# Patient Record
Sex: Male | Born: 1995 | Race: White | Hispanic: No | Marital: Single | Smoking: Current every day smoker
Health system: Southern US, Community
[De-identification: ages and names within clinical notes are randomized; demographics above are authoritative.]

## PROBLEM LIST (undated history)

## (undated) HISTORY — PX: CARDIAC SURGERY: SHX584

---
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
## 2017-07-28 ENCOUNTER — Inpatient Hospital Stay
Admit: 2017-07-28 | Discharge: 2017-07-28 | Disposition: A | Discharge status: Home / Self Care | Attending: Emergency Medicine

## 2017-07-28 DIAGNOSIS — S0121XA Laceration without foreign body of nose, initial encounter: Secondary | ICD-10-CM

## 2017-07-28 MED ORDER — TETANUS-DIPHTH-ACELL PERTUSSIS 5-2.5-18.5 LF-MCG/0.5 IM SUSP
Freq: Once | INTRAMUSCULAR | Status: AC
Start: 2017-07-28 — End: 2017-07-28
  Administered 2017-07-28: 23:00:00 0.5 mL via INTRAMUSCULAR

## 2017-07-28 MED ORDER — AMOXICILLIN-POT CLAVULANATE 875-125 MG PO TABS
875-125 MG | ORAL_TABLET | Freq: Two times a day (BID) | ORAL | 0 refills | Status: AC
Start: 2017-07-28 — End: 2017-08-04

## 2017-07-28 MED ORDER — BACITRACIN ZINC 500 UNIT/GM EX OINT
500 UNIT/GM | Freq: Once | CUTANEOUS | Status: AC
Start: 2017-07-28 — End: 2017-07-28
  Administered 2017-07-28: 22:00:00 via TOPICAL

## 2017-07-28 MED ORDER — AMOXICILLIN-POT CLAVULANATE 875-125 MG PO TABS
875-125 MG | Freq: Once | ORAL | Status: AC
Start: 2017-07-28 — End: 2017-07-28
  Administered 2017-07-28: 22:00:00 1 via ORAL

## 2017-07-28 MED FILL — BOOSTRIX 5-2.5-18.5 LF-MCG/0.5 IM SUSP: 5-2.5-18.5 LF-MCG/0.5 | INTRAMUSCULAR | Qty: 0.5

## 2017-07-28 MED FILL — AMOXICILLIN-POT CLAVULANATE 875-125 MG PO TABS: 875-125 mg | ORAL | Qty: 1

## 2017-07-28 NOTE — ED Provider Notes (Signed)
Department of Emergency Medicine   ED  Provider Note  Admit Date/RoomTime: 07/28/2017  4:37 PM  ED Room: 02/02                HPI:  07/28/17, Time: 5:25 PM  .       Brett Miller is a 22 y.o. old male presenting to the emergency department for a laceration to the nose, caused by his dog, which occured 1 hour(s) prior to arrival. There is not a possibility of retained foreign body in the affected area.  The patients tetanus status is unknown.   Bleeding is  controlled. There is no pain at injury site. The injury was not work related.         Review of Systems:   Pertinent positives and negatives are stated within HPI, all other systems reviewed and are negative.        --------------------------------------------- PAST HISTORY ---------------------------------------------  Past Medical History:  has no past medical history on file.    Past Surgical History:  has no past surgical history on file.    Social History:  reports that he has never smoked. He has never used smokeless tobacco. He reports that he does not drink alcohol or use drugs.    Family History: family history is not on file.     The patient's home medications have been reviewed.    Allergies: Patient has no known allergies.    -------------------------------------------------- RESULTS -------------------------------------------------  All laboratory and radiology results have been personally reviewed by myself   LABS:  No results found for this visit on 07/28/17.    RADIOLOGY:  Interpreted by Radiologist.  No orders to display       ------------------------- NURSING NOTES AND VITALS REVIEWED ---------------------------   The nursing notes within the ED encounter and vital signs as below have been reviewed.   BP 122/62   Pulse 75   Temp 97.7 F (36.5 C)   Resp 15   Ht 5\' 8"  (1.727 m)   Wt 125 lb (56.7 kg)   SpO2 99%   BMI 19.01 kg/m   Oxygen Saturation Interpretation: Normal      ---------------------------------------------------PHYSICAL  EXAM--------------------------------------      Constitutional/General: Alert and oriented x3, well appearing, non toxic in NAD  Head: Normocephalic and atraumatic  Eyes: PERRL, EOMI  Mouth: Oropharynx clear, handling secretions, no trismus  Neck: Supple, full ROM,   Pulmonary: Lungs clear to auscultation bilaterally, no wheezes, rales, or rhonchi. Not in respiratory distress  Cardiovascular:  Regular rate and rhythm, no murmurs, gallops, or rubs. 2+ distal pulses  Abdomen: Soft, non tender, non distended,   Extremities: Moves all extremities x 4. Warm and well perfused  Skin: warm and dry without rash. There is a laceration of the nose, which measures 3 cm. It is a partial thickness laceration. There is no evidence of foreign body or gross contamination.   Neurologic: GCS 15,  Psych: Normal Affect      ------------------------------ ED COURSE/MEDICAL DECISION MAKING----------------------  Medications   amoxicillin-clavulanate (AUGMENTIN) 875-125 MG per tablet 1 tablet (1 tablet Oral Given 07/28/17 1725)             LACERATION REPAIR  PROCEDURE NOTE:  Unless otherwise indicated, this procedure was done or directly supervised by the ED attending.    Laceration #: 1.  Location: Nose  Length: 3 cm.  The wound area was cleansend with shur-clens  The wound area was anesthetized with not required.  WOUND COMPLEXITY:  Debridement: None.  Undermining: None.  Wound Margins Revised: None.  Flaps Aligned: no.  The wound was explored with the following results no foreign body or tendon injury seen.  The wound was closed with steri strips.  Dressing:  bacitracin and a sterile dressing was placed.    Total number steri-strips: 5      Medical Decision Making:    Patient presents to the ED for a dog bite to the face. The laceration was thoroughly irrigated and steri strips were applied to maintain alignment. I educated patient on reasons to return, particularly if the wound began to look infected. Ppx with augmentin given in ED  and script for rx as well.    Counseling:   The emergency provider has spoken with the patient and discussed today's results, in addition to providing specific details for the plan of care and counseling regarding the diagnosis and prognosis.  Questions are answered at this time and they are agreeable with the plan.      --------------------------------- IMPRESSION AND DISPOSITION ---------------------------------    IMPRESSION  1. Dog bite of nose, initial encounter    2. Laceration of nose, initial encounter        DISPOSITION  Disposition: Discharge to home  Patient condition is good         Elita Boone, DO  Resident  07/28/17 1728

## 2021-08-04 ENCOUNTER — Encounter: Payer: Self-pay | Admitting: Emergency Medicine

## 2021-08-04 ENCOUNTER — Emergency Department
Admission: EM | Admit: 2021-08-04 | Discharge: 2021-08-05 | Disposition: A | Discharge status: Home / Self Care | Payer: Self-pay | Attending: Emergency Medicine | Admitting: Emergency Medicine

## 2021-08-04 ENCOUNTER — Other Ambulatory Visit: Payer: Self-pay

## 2021-08-04 ENCOUNTER — Emergency Department: Payer: Self-pay

## 2021-08-04 DIAGNOSIS — F419 Anxiety disorder, unspecified: Secondary | ICD-10-CM | POA: Insufficient documentation

## 2021-08-04 DIAGNOSIS — R0789 Other chest pain: Secondary | ICD-10-CM

## 2021-08-04 LAB — URINALYSIS, COMPLETE (UACMP) WITH MICROSCOPIC
Bilirubin Urine: NEGATIVE
Glucose, UA: NEGATIVE mg/dL
Hgb urine dipstick: NEGATIVE
Ketones, ur: NEGATIVE mg/dL
Leukocytes,Ua: NEGATIVE
Nitrite: NEGATIVE
Protein, ur: NEGATIVE mg/dL
Specific Gravity, Urine: 1.024 (ref 1.005–1.030)
WBC, UA: NONE SEEN WBC/hpf (ref 0–5)
pH: 7 (ref 5.0–8.0)

## 2021-08-04 LAB — TROPONIN I (HIGH SENSITIVITY): Troponin I (High Sensitivity): 2 ng/L (ref <18)

## 2021-08-04 LAB — CBC
HCT: 45.5 % (ref 39.0–52.0)
Hemoglobin: 15.2 g/dL (ref 13.0–17.0)
MCH: 29.9 pg (ref 26.0–34.0)
MCHC: 33.4 g/dL (ref 30.0–36.0)
MCV: 89.4 fL (ref 80.0–100.0)
Platelets: 315 10*3/uL (ref 150–400)
RBC: 5.09 MIL/uL (ref 4.22–5.81)
RDW: 11.8 % (ref 11.5–15.5)
WBC: 6 10*3/uL (ref 4.0–10.5)
nRBC: 0 % (ref 0.0–0.2)

## 2021-08-04 LAB — COMPREHENSIVE METABOLIC PANEL
ALT: 17 U/L (ref 0–44)
AST: 19 U/L (ref 15–41)
Albumin: 4.6 g/dL (ref 3.5–5.0)
Alkaline Phosphatase: 63 U/L (ref 38–126)
Anion gap: 7 (ref 5–15)
BUN: 16 mg/dL (ref 6–20)
CO2: 28 mmol/L (ref 22–32)
Calcium: 9.2 mg/dL (ref 8.9–10.3)
Chloride: 104 mmol/L (ref 98–111)
Creatinine, Ser: 0.84 mg/dL (ref 0.61–1.24)
GFR, Estimated: >60 mL/min (ref >60)
Glucose, Bld: 125 mg/dL — ABNORMAL HIGH (ref 70–99)
Potassium: 3.9 mmol/L (ref 3.5–5.1)
Sodium: 139 mmol/L (ref 135–145)
Total Bilirubin: 0.5 mg/dL (ref 0.3–1.2)
Total Protein: 7.2 g/dL (ref 6.5–8.1)

## 2021-08-04 LAB — LIPASE, BLOOD: Lipase: 31 U/L (ref 11–51)

## 2021-08-04 MED ORDER — ALUMINUM-MAGNESIUM-SIMETHICONE 200-200-20 MG/5ML PO SUSP
30.0000 mL | Freq: Three times a day (TID) | ORAL | 0 refills | Status: DC
Start: 2021-08-04 — End: 2021-08-05

## 2021-08-04 MED ORDER — FAMOTIDINE 20 MG PO TABS: 20.0000 mg | ORAL_TABLET | Freq: Two times a day (BID) | ORAL | 0 refills | Status: DC

## 2021-08-04 MED ORDER — ALUM & MAG HYDROXIDE-SIMETH 200-200-20 MG/5ML PO SUSP
30.0000 mL | Freq: Once | ORAL | Status: AC
Start: 2021-08-04 — End: 2021-08-04
  Administered 2021-08-04: 30 mL via ORAL
  Filled 2021-08-04: qty 30

## 2021-08-04 MED ORDER — FAMOTIDINE 20 MG PO TABS
40.0000 mg | ORAL_TABLET | Freq: Once | ORAL | Status: AC
  Administered 2021-08-04: 40 mg via ORAL
  Filled 2021-08-04: qty 2

## 2021-08-04 MED ORDER — HYDROXYZINE HCL 25 MG PO TABS: 25.0000 mg | ORAL_TABLET | Freq: Three times a day (TID) | ORAL | 0 refills | Status: DC | PRN

## 2021-08-04 MED ORDER — ONDANSETRON HCL 4 MG/2ML IJ SOLN
4.0000 mg | Freq: Once | INTRAMUSCULAR | Status: AC
  Administered 2021-08-04: 4 mg via INTRAVENOUS
  Filled 2021-08-04: qty 2

## 2021-08-04 MED ORDER — HYDROXYZINE HCL 25 MG PO TABS
25.0000 mg | ORAL_TABLET | Freq: Once | ORAL | Status: AC
Start: 2021-08-04 — End: 2021-08-04
  Administered 2021-08-04: 25 mg via ORAL
  Filled 2021-08-04: qty 1

## 2021-08-04 NOTE — ED Triage Notes (Signed)
Pt endorses [place] use approx 8hrs PTA.   Pt give urine cup and encouraged to provide urine sample, states is unable to provide sample at this time. Pt given sample cup and directed to the lobby to await room.

## 2021-08-04 NOTE — ED Triage Notes (Signed)
Pt to ED via POV with c/o R sided chest pain and epigastric pain. Pt states happened approx 1 yr ago and was intermittent in nature. Pt states "I got myself in some situations and I think it's fucking me up". Pt states pain feel like "I'm gonna die". PT states pain is constant when it happens and pt's friend reports that he gets fidgety. Pt reports drinking alcohol and smoking weed and pain worsens. Pt unable to state when he began having episodes of chest pain again. Pt appears anxious in triage.

## 2021-08-04 NOTE — ED Notes (Signed)
Pt states he has been having chest pain that started around 6 months ago. Pt states that it comes and goes. Pt is not c/o chest pain at the moment.   Cardiac monitor reads NSR. Pt respirations are even and unlabored. Pt is talking in complete sentences. Pt does appear to be anxious. Pt NAD at this time.

## 2021-08-04 NOTE — ED Provider Notes (Signed)
[facility] Provider Note    Event Date/Time   First MD Initiated Contact with Patient 08/04/21 2122     (approximate)   History   Chest Pain   HPI  Cody Villa is a 26 y.o. male with a past history of ADHD who comes ED complaining of feeling anxious for the past few days, not improved with smoking marijuana today.  He reports that he gets some chest discomfort which radiates to his epigastrium.  He does go through periods of frequent heavy drinking, but does not think this is related.  He has been eating and drinking normally.  No vomiting or diarrhea, no black or bloody stool, no exertional symptoms.  No shortness of breath.     Physical Exam   Triage Vital Signs: ED Triage Vitals  Enc Vitals Group     BP 08/04/21 1918 124/89     Pulse Rate 08/04/21 1918 86     Resp 08/04/21 1918 20     Temp 08/04/21 1918 98.4 F (36.9 C)     Temp Source 08/04/21 1918 Oral     SpO2 08/04/21 1918 99 %     Weight 08/04/21 1919 125 lb (56.7 kg)     Height 08/04/21 1919 5\' 9"  (1.753 m)     Head Circumference --      Peak Flow --      Pain Score 08/04/21 1923 2     Pain Loc --      Pain Edu? --      Excl. in GC? --     Most recent vital signs: Vitals:   08/04/21 1918 08/04/21 2235  BP: 124/89 122/70  Pulse: 86 79  Resp: 20 16  Temp: 98.4 F (36.9 C)   SpO2: 99% 100%     General: Awake, no distress.  CV:  Good peripheral perfusion.  Regular rate and rhythm Resp:  Normal effort.  Clear to auscultation bilaterally Abd:  No distention.  Soft and nontender Other:  No lower extremity swelling or calf tenderness.  Moist mucosa.   ED Results / Procedures / Treatments   Labs (all labs ordered are listed, but only abnormal results are displayed) Labs Reviewed  COMPREHENSIVE METABOLIC PANEL - Abnormal; Notable for the following components:      Result Value   Glucose, Bld 125 (*)    All other components within normal limits  URINALYSIS, COMPLETE  (UACMP) WITH MICROSCOPIC - Abnormal; Notable for the following components:   Color, Urine YELLOW (*)    APPearance CLOUDY (*)    Bacteria, UA RARE (*)    All other components within normal limits  CBC  LIPASE, BLOOD  TROPONIN I (HIGH SENSITIVITY)     EKG  Interpreted by me Normal sinus rhythm rate of 90.  Normal axis intervals QRS ST segments and T waves.   RADIOLOGY Chest x-ray viewed and interpreted by me, appears unremarkable.  Radiology report reviewed    PROCEDURES:  Critical Care performed: No  Procedures   MEDICATIONS ORDERED IN ED: Medications  alum & mag hydroxide-simeth (MAALOX/MYLANTA) 200-200-20 MG/5ML suspension 30 mL (30 mLs Oral Given 08/04/21 2243)  famotidine (PEPCID) tablet 40 mg (40 mg Oral Given 08/04/21 2239)  ondansetron (ZOFRAN) injection 4 mg (4 mg Intravenous Given 08/04/21 2240)  hydrOXYzine (ATARAX) tablet 25 mg (25 mg Oral Given 08/04/21 2239)     IMPRESSION / MDM / ASSESSMENT AND PLAN / ED COURSE  I reviewed the triage vital signs and the nursing  notes.                              Differential diagnosis includes, but is not limited to, GERD, pancreatitis, marijuana side effect, dehydration    Patient presents with atypical, noncardiac chest pain and anxiousness.  Will give hydroxyzine as well as GI cocktail.  His vital signs, EKG, chest x-ray, labs are all reassuring, he is not requiring admission.     FINAL CLINICAL IMPRESSION(S) / ED DIAGNOSES   Final diagnoses:  Atypical chest pain     Rx / DC Orders   ED Discharge Orders          Ordered    hydrOXYzine (ATARAX) 25 MG tablet  3 times daily PRN        08/04/21 2326    aluminum-magnesium hydroxide-simethicone (MAALOX) 200-200-20 MG/5ML SUSP  3 times daily before meals & bedtime        08/04/21 2326    famotidine (PEPCID) 20 MG tablet  2 times daily        08/04/21 2326             Note:  This document was prepared using Dragon voice recognition software and may  include unintentional dictation errors.   Sharman Cheek, MD 08/04/21 [phone]

## 2021-08-05 MED ORDER — ALUMINUM-MAGNESIUM-SIMETHICONE 200-200-20 MG/5ML PO SUSP: 30.0000 mL | Freq: Three times a day (TID) | ORAL | 0 refills | Status: AC

## 2021-08-05 MED ORDER — FAMOTIDINE 20 MG PO TABS: 20.0000 mg | ORAL_TABLET | Freq: Two times a day (BID) | ORAL | 0 refills | Status: AC

## 2021-08-05 MED ORDER — HYDROXYZINE HCL 25 MG PO TABS: 25.0000 mg | ORAL_TABLET | Freq: Three times a day (TID) | ORAL | 0 refills | Status: AC | PRN

## 2021-08-05 NOTE — ED Notes (Signed)
Pt verbalized understanding of discharge instructions, follow-up care instructions, and prescriptions. E-signature pad is not working so Audiological scientist not available. Pt advised if symptoms worsen to return to ED.

## 2022-06-04 ENCOUNTER — Encounter: Payer: Self-pay | Admitting: Emergency Medicine

## 2022-06-04 ENCOUNTER — Other Ambulatory Visit: Payer: Self-pay

## 2022-06-04 ENCOUNTER — Emergency Department: Payer: Self-pay

## 2022-06-04 DIAGNOSIS — R42 Dizziness and giddiness: Secondary | ICD-10-CM | POA: Insufficient documentation

## 2022-06-04 DIAGNOSIS — Z1152 Encounter for screening for COVID-19: Secondary | ICD-10-CM | POA: Insufficient documentation

## 2022-06-04 DIAGNOSIS — Z5321 Procedure and treatment not carried out due to patient leaving prior to being seen by health care provider: Secondary | ICD-10-CM | POA: Insufficient documentation

## 2022-06-04 DIAGNOSIS — R079 Chest pain, unspecified: Secondary | ICD-10-CM | POA: Insufficient documentation

## 2022-06-04 DIAGNOSIS — R0602 Shortness of breath: Secondary | ICD-10-CM | POA: Insufficient documentation

## 2022-06-04 DIAGNOSIS — R531 Weakness: Secondary | ICD-10-CM | POA: Insufficient documentation

## 2022-06-04 LAB — CBC
HCT: 46 % (ref 39.0–52.0)
Hemoglobin: 15.7 g/dL (ref 13.0–17.0)
MCH: 30.3 pg (ref 26.0–34.0)
MCHC: 34.1 g/dL (ref 30.0–36.0)
MCV: 88.6 fL (ref 80.0–100.0)
Platelets: 284 10*3/uL (ref 150–400)
RBC: 5.19 MIL/uL (ref 4.22–5.81)
RDW: 11.7 % (ref 11.5–15.5)
WBC: 6.8 10*3/uL (ref 4.0–10.5)
nRBC: 0 % (ref 0.0–0.2)

## 2022-06-04 LAB — BASIC METABOLIC PANEL
Anion gap: 6 (ref 5–15)
BUN: 18 mg/dL (ref 6–20)
CO2: 27 mmol/L (ref 22–32)
Calcium: 9.6 mg/dL (ref 8.9–10.3)
Chloride: 108 mmol/L (ref 98–111)
Creatinine, Ser: 0.77 mg/dL (ref 0.61–1.24)
GFR, Estimated: >60 mL/min (ref >60)
Glucose, Bld: 104 mg/dL — ABNORMAL HIGH (ref 70–99)
Potassium: 3.8 mmol/L (ref 3.5–5.1)
Sodium: 141 mmol/L (ref 135–145)

## 2022-06-04 LAB — RESP PANEL BY RT-PCR (RSV, FLU A&B, COVID)  RVPGX2
Influenza A by PCR: NEGATIVE
Influenza B by PCR: NEGATIVE
Resp Syncytial Virus by PCR: NEGATIVE
SARS Coronavirus 2 by RT PCR: NEGATIVE

## 2022-06-04 LAB — TROPONIN I (HIGH SENSITIVITY): Troponin I (High Sensitivity): 3 ng/L (ref <18)

## 2022-06-04 NOTE — ED Provider Triage Note (Signed)
Emergency Medicine Provider Triage Evaluation Note  Ziv Welchel, a 26 y.o. male  was evaluated in triage.  Pt complains of chest pain, SOB, nausea, bodyaches. He notes symptoms with onset 1 week prior.   Review of Systems  Positive: Cough, SOB Negative: FCS  Physical Exam  Ht 5\' 8"  (1.727 m)   Wt 59 kg   BMI 19.77 kg/m  Gen:   Awake, no distress  NAD Resp:  Normal effort CTA MSK:   Moves extremities without difficulty  Other:    Medical Decision Making  Medically screening exam initiated at 10:55 PM.  Appropriate orders placed.  Idrees Quam was informed that the remainder of the evaluation will be completed by another provider, this initial triage assessment does not replace that evaluation, and the importance of remaining in the ED until their evaluation is complete.  Patient to the ED for evaluation of bodyaches, cough, and SOB.    Gordy Levan, PA-C 06/04/22 2257

## 2022-06-04 NOTE — ED Triage Notes (Signed)
Patient ambulatory to triage with steady gait, without difficulty or distress noted; pt reports upper CP accomp by [facility] and dizziness x wk

## 2022-06-05 ENCOUNTER — Emergency Department
Admission: EM | Admit: 2022-06-05 | Discharge: 2022-06-05 | Discharge status: Against Medical Advice | Payer: Self-pay | Attending: Student in an Organized Health Care Education/Training Program | Admitting: Student in an Organized Health Care Education/Training Program
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
## 2022-10-22 ENCOUNTER — Emergency Department: Payer: Self-pay

## 2022-10-22 ENCOUNTER — Other Ambulatory Visit: Payer: Self-pay

## 2022-10-22 ENCOUNTER — Emergency Department
Admission: EM | Admit: 2022-10-22 | Discharge: 2022-10-22 | Disposition: A | Discharge status: Home / Self Care | Payer: Self-pay | Attending: Emergency Medicine | Admitting: Emergency Medicine

## 2022-10-22 DIAGNOSIS — S4992XA Unspecified injury of left shoulder and upper arm, initial encounter: Secondary | ICD-10-CM | POA: Diagnosis present

## 2022-10-22 DIAGNOSIS — S42022A Displaced fracture of shaft of left clavicle, initial encounter for closed fracture: Secondary | ICD-10-CM | POA: Diagnosis not present

## 2022-10-22 DIAGNOSIS — Y92410 Unspecified street and highway as the place of occurrence of the external cause: Secondary | ICD-10-CM | POA: Diagnosis not present
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
## 2022-10-22 NOTE — Discharge Instructions (Signed)
Keep your arm in the sling for your clavicle fracture.  Please follow-up with orthopedics.  You may take Tylenol/ibuprofen per package instructions to help with your symptoms.  Please return for any new, worsening, or change in symptoms or other concerns.  It was a pleasure caring for you today.
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
## 2022-12-19 IMAGING — CR DG CHEST 2V
1 series · 2 of 2 positions shown · non-contrast
Comparison: None.

CLINICAL DATA: Right-sided chest pain.  Epigastric pain.

EXAM:
CHEST - 2 VIEW

[Series 1: dg chest 2 view · 0.14mm/px · 2 of 2 slices shown]
[im 1/2]
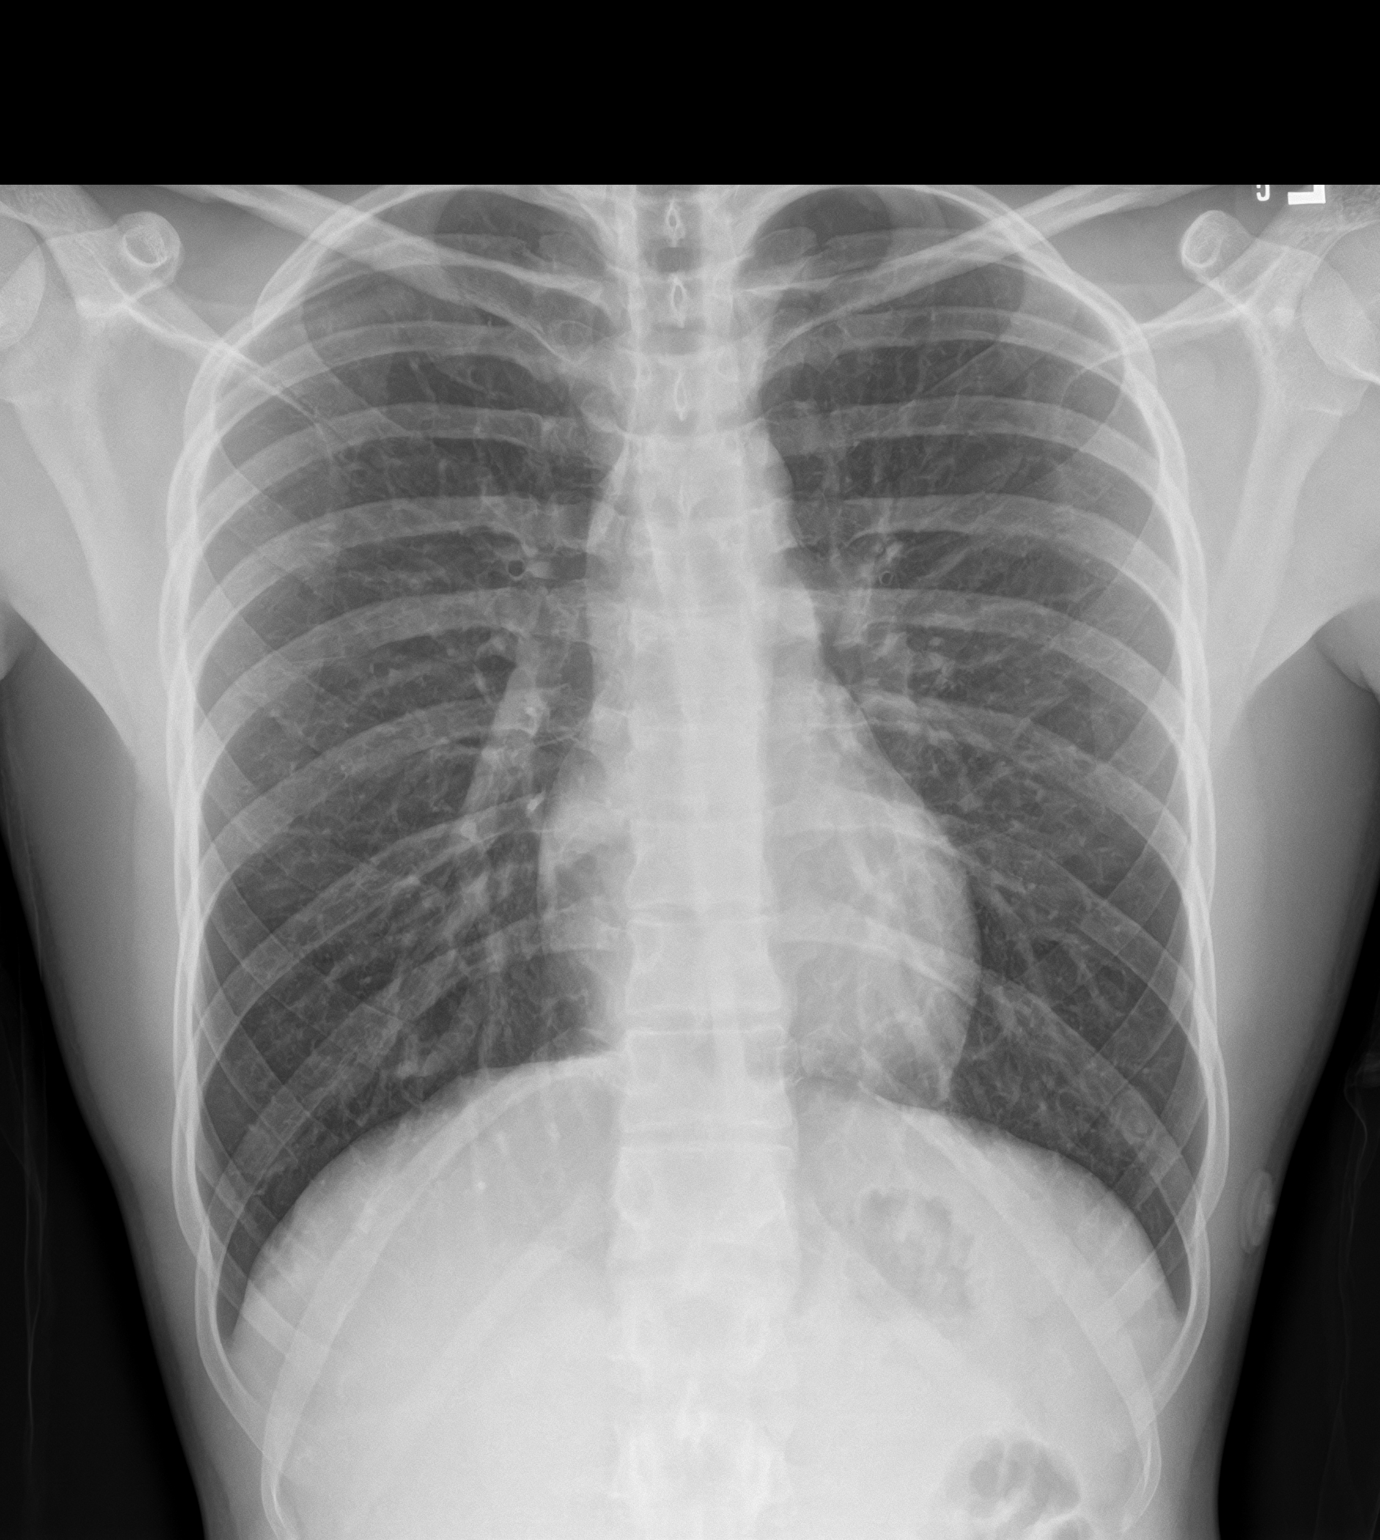
[im 2/2]
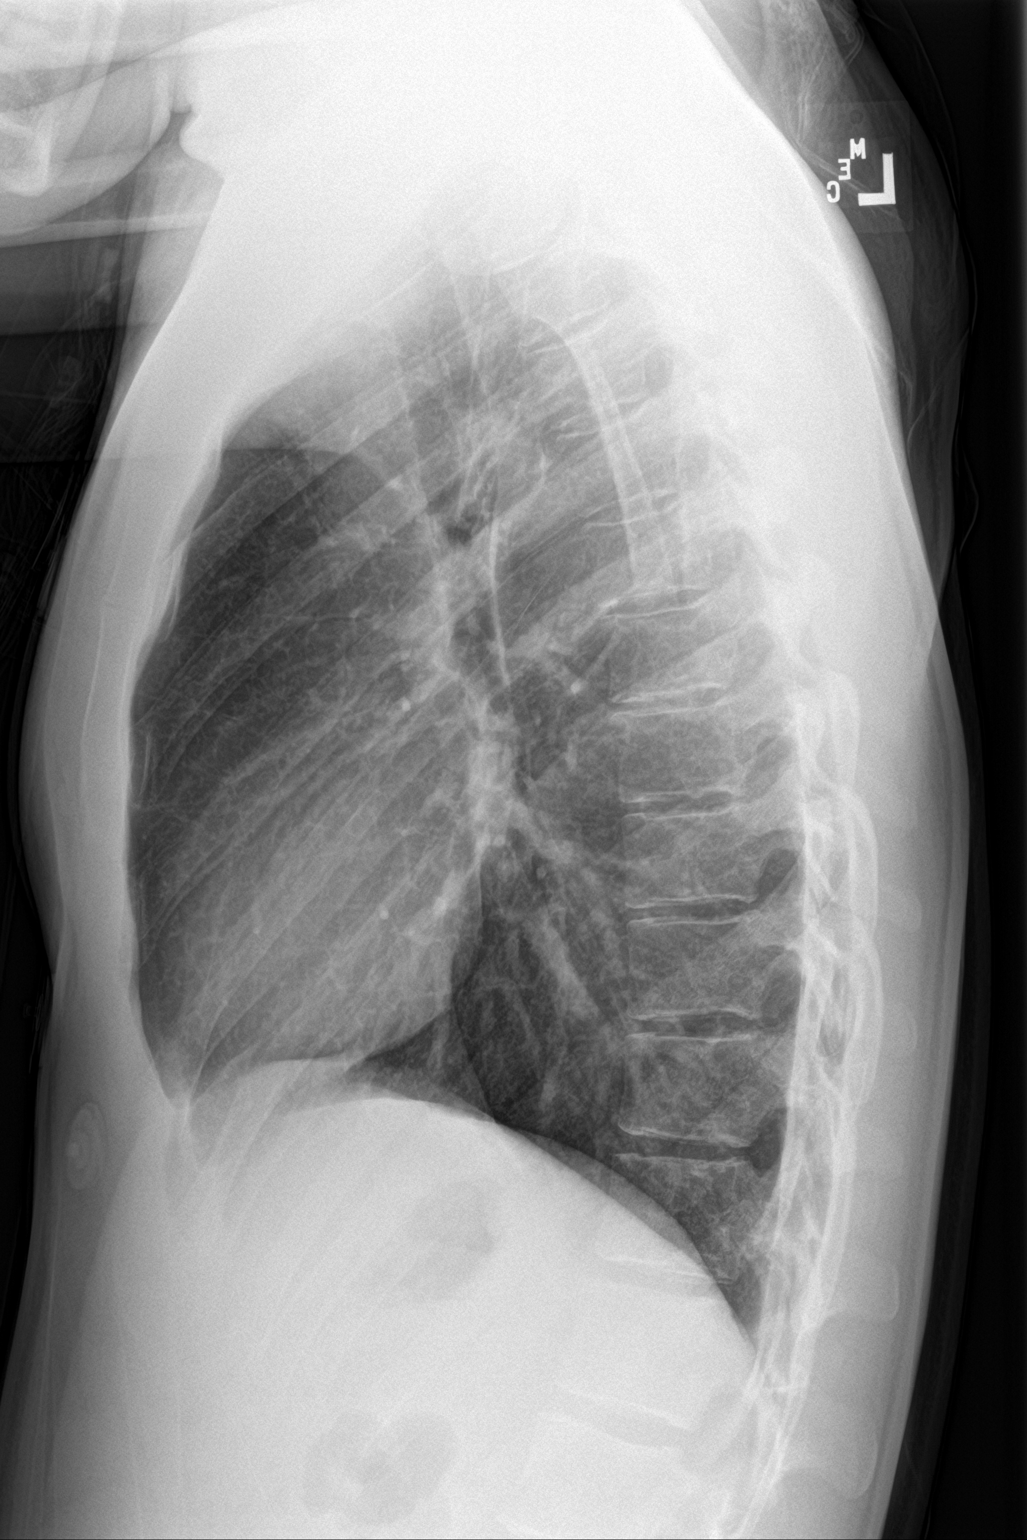

[2 of 2 positions shown; findings below may reference images not displayed]

FINDINGS: The cardiomediastinal contours are normal. The lungs are clear.
Pulmonary vasculature is normal. No consolidation, pleural effusion,
or pneumothorax. No acute osseous abnormalities are seen.
IMPRESSION: Negative radiographs of the chest.
